# Patient Record
Sex: Female | Born: 2000 | Race: White | Hispanic: No | Marital: Single | Smoking: Never smoker
Health system: Southern US, Community
[De-identification: ages and names within clinical notes are randomized; demographics above are authoritative.]

## PROBLEM LIST (undated history)

## (undated) DIAGNOSIS — D649 Anemia, unspecified: Secondary | ICD-10-CM

---
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
## 2020-05-06 ENCOUNTER — Other Ambulatory Visit: Payer: Self-pay

## 2020-05-06 ENCOUNTER — Emergency Department (HOSPITAL_BASED_OUTPATIENT_CLINIC_OR_DEPARTMENT_OTHER)
Admission: EM | Admit: 2020-05-06 | Discharge: 2020-05-07 | Disposition: A | Discharge status: Home / Self Care | Payer: Managed Care, Other (non HMO) | Attending: Emergency Medicine | Admitting: Emergency Medicine

## 2020-05-06 ENCOUNTER — Encounter (HOSPITAL_BASED_OUTPATIENT_CLINIC_OR_DEPARTMENT_OTHER): Payer: Self-pay | Admitting: *Deleted

## 2020-05-06 DIAGNOSIS — Z5321 Procedure and treatment not carried out due to patient leaving prior to being seen by health care provider: Secondary | ICD-10-CM | POA: Insufficient documentation

## 2020-05-06 DIAGNOSIS — M79652 Pain in left thigh: Secondary | ICD-10-CM | POA: Insufficient documentation

## 2020-05-06 NOTE — ED Triage Notes (Signed)
Pt c/o left thigh pain w/o injury x 4 hrs

## 2022-03-08 ENCOUNTER — Ambulatory Visit
Admission: EM | Admit: 2022-03-08 | Discharge: 2022-03-08 | Disposition: A | Discharge status: Home / Self Care | Payer: Managed Care, Other (non HMO)

## 2022-03-08 ENCOUNTER — Encounter: Payer: Self-pay | Admitting: Emergency Medicine

## 2022-03-08 DIAGNOSIS — H00012 Hordeolum externum right lower eyelid: Secondary | ICD-10-CM | POA: Diagnosis not present

## 2022-03-08 HISTORY — DX: Anemia, unspecified: D64.9

## 2022-03-08 MED ORDER — CIPROFLOXACIN HCL 0.3 % OP SOLN: OPHTHALMIC | 0 refills | Status: AC

## 2022-03-08 MED ORDER — AMOXICILLIN-POT CLAVULANATE 875-125 MG PO TABS: 1.0000 | ORAL_TABLET | Freq: Two times a day (BID) | ORAL | 0 refills | Status: AC

## 2022-03-08 NOTE — ED Provider Notes (Signed)
[facility]    CSN: [id number] Arrival date & time: 03/08/22  1341    HISTORY   Chief Complaint  Patient presents with   Eye Problem   HPI Cindy Santos is a 21 y.o. female. Patient presents urgent care complaining of a stye to her bottom right eyelid for the past 4 days.  Patient states she has been applying warm compresses several times a day along with moist teabags with little relief of symptoms.  Patient states she is never had a stye before.  The history is provided by the patient.  Past Medical History:  Diagnosis Date   Anemia    There are no problems to display for this patient.  History reviewed. No pertinent surgical history. OB History   No obstetric history on file.    Home Medications    Prior to Admission medications   Medication Sig Start Date End Date Taking? Authorizing Provider  ferrous sulfate 325 (65 FE) MG tablet TAKE 1 TABLET (325 MG TOTAL) BY MOUTH 3 TIMES DAILY WITH MEALS. 09/22/21  Yes [provider]  fluticasone (FLONASE) 50 MCG/ACT nasal spray AP1 INTO EACH NOSTRIL AS NEEDED DURING ALLERGY SEASON. 03/04/15  Yes [provider]   Family History Family History  Family history unknown: Yes   Social History Social History   Tobacco Use   Smoking status: Never  Substance Use Topics   Alcohol use: Not Currently   Drug use: Not Currently   Allergies   Patient has no known allergies.  Review of Systems Review of Systems Pertinent findings noted in history of present illness.   Physical Exam Triage Vital Signs ED Triage Vitals  Enc Vitals Group     BP 08/15/21 0827 (!) 147/82     Pulse Rate 08/15/21 0827 72     Resp 08/15/21 0827 18     Temp 08/15/21 0827 98.3 F (36.8 C)     Temp Source 08/15/21 0827 Oral     SpO2 08/15/21 0827 98 %     Weight --      Height --      Head Circumference --      Peak Flow --      Pain Score 08/15/21 0826 5     Pain Loc --      Pain Edu? --      Excl. in [place]? --    No data found.  Updated Vital Signs BP 112/76   Pulse (!) 54   Temp 97.9 F (36.6 C)   Resp 18   SpO2 98%   Physical Exam Vitals and nursing note reviewed.  Constitutional:      General: She is not in acute distress.    Appearance: Normal appearance. She is not ill-appearing.  HENT:     Head: Normocephalic and atraumatic.     Salivary Glands: Right salivary gland is not diffusely enlarged or tender. Left salivary gland is not diffusely enlarged or tender.     Right Ear: Tympanic membrane, ear canal and external ear normal. No drainage. No middle ear effusion. There is no impacted cerumen. Tympanic membrane is not erythematous or bulging.     Left Ear: Tympanic membrane, ear canal and external ear normal. No drainage.  No middle ear effusion. There is no impacted cerumen. Tympanic membrane is not erythematous or bulging.     Nose: Nose normal. No nasal deformity, septal deviation, mucosal edema, congestion or rhinorrhea.     Right Turbinates: Not enlarged, swollen or  pale.     Left Turbinates: Not enlarged, swollen or pale.     Right Sinus: No maxillary sinus tenderness or frontal sinus tenderness.     Left Sinus: No maxillary sinus tenderness or frontal sinus tenderness.     Mouth/Throat:     Lips: Pink. No lesions.     Mouth: Mucous membranes are moist. No oral lesions.     Pharynx: Oropharynx is clear. Uvula midline. No posterior oropharyngeal erythema or uvula swelling.     Tonsils: No tonsillar exudate. 0 on the right. 0 on the left.  Eyes:     General: Lids are everted, no foreign bodies appreciated. Vision grossly intact.        Right eye: Hordeolum present. No foreign body or discharge.        Left eye: No foreign body, discharge or hordeolum.     Extraocular Movements: Extraocular movements intact.     Conjunctiva/sclera: Conjunctivae normal.     Right eye: Right conjunctiva is not injected.     Left eye: Left conjunctiva is not injected.  Neck:     Trachea: Trachea  and phonation normal.  Cardiovascular:     Rate and Rhythm: Normal rate and regular rhythm.     Pulses: Normal pulses.     Heart sounds: Normal heart sounds. No murmur heard.   No friction rub. No gallop.  Pulmonary:     Effort: Pulmonary effort is normal. No accessory muscle usage, prolonged expiration or respiratory distress.     Breath sounds: Normal breath sounds. No stridor, decreased air movement or transmitted upper airway sounds. No decreased breath sounds, wheezing, rhonchi or rales.  Chest:     Chest wall: No tenderness.  Musculoskeletal:        General: Normal range of motion.     Cervical back: Normal range of motion and neck supple. Normal range of motion.  Lymphadenopathy:     Cervical: No cervical adenopathy.  Skin:    General: Skin is warm and dry.     Findings: No erythema or rash.  Neurological:     General: No focal deficit present.     Mental Status: She is alert and oriented to person, place, and time.  Psychiatric:        Mood and Affect: Mood normal.        Behavior: Behavior normal.    Visual Acuity Right Eye Distance:   Left Eye Distance:   Bilateral Distance:    Right Eye Near:   Left Eye Near:    Bilateral Near:     UC Couse / Diagnostics / Procedures:    EKG  Radiology No results found.  Procedures Procedures (including critical care time)  UC Diagnoses / Final Clinical Impressions(s)   I have reviewed the triage vital signs and the nursing notes.  Pertinent labs & imaging results that were available during my care of the patient were reviewed by me and considered in my medical decision making (see chart for details).   Final diagnoses:  Hordeolum externum of right lower eyelid   Patient provided with cephalexin ophthalmic solution and Augmentin for possible preseptal cellulitis.  Return precautions advised.  ED Prescriptions     Medication Sig Dispense Auth. Provider   ciprofloxacin (CILOXAN) 0.3 % ophthalmic solution Administer 1  drop, every 2 hours, while awake, for 2 days. Then 1 drop, every 4 hours, while awake, for the next 5 days. 5 mL Lynden Oxford Scales, PA-C   amoxicillin-clavulanate (AUGMENTIN) 875-125 MG  tablet Take 1 tablet by mouth every 12 (twelve) hours for 5 days. 10 tablet Lynden Oxford Scales, PA-C      PDMP not reviewed this encounter.  Pending results:  Labs Reviewed - No data to display  Medications Ordered in UC: Medications - No data to display  Disposition Upon Discharge:  Condition: stable for discharge home Home: take medications as prescribed; routine discharge instructions as discussed; follow up as advised.  Patient presented with an acute illness with associated systemic symptoms and significant discomfort requiring urgent management. In my opinion, this is a condition that a prudent lay person (someone who possesses an average knowledge of health and medicine) may potentially expect to result in complications if not addressed urgently such as respiratory distress, impairment of bodily function or dysfunction of bodily organs.   Routine symptom specific, illness specific and/or disease specific instructions were discussed with the patient and/or caregiver at length.   As such, the patient has been evaluated and assessed, work-up was performed and treatment was provided in alignment with urgent care protocols and evidence based medicine.  Patient/parent/caregiver has been advised that the patient may require follow up for further testing and treatment if the symptoms continue in spite of treatment, as clinically indicated and appropriate.  If the patient was tested for COVID-19, Influenza and/or RSV, then the patient/parent/guardian was advised to isolate at home pending the results of his/her diagnostic coronavirus test and potentially longer if they're positive. I have also advised pt that if his/her COVID-19 test returns positive, it's recommended to self-isolate for at least 10 days  after symptoms first appeared AND until fever-free for 24 hours without fever reducer AND other symptoms have improved or resolved. Discussed self-isolation recommendations as well as instructions for household member/close contacts as per the [facility] and [place] DHHS, and also gave patient the [place] packet with this information.  Patient/parent/caregiver has been advised to return to the [facility] or PCP in 3-5 days if no better; to PCP or the Emergency Department if new signs and symptoms develop, or if the current signs or symptoms continue to change or worsen for further workup, evaluation and treatment as clinically indicated and appropriate  The patient will follow up with their current PCP if and as advised. If the patient does not currently have a PCP we will assist them in obtaining one.   The patient may need specialty follow up if the symptoms continue, in spite of conservative treatment and management, for further workup, evaluation, consultation and treatment as clinically indicated and appropriate.  Patient/parent/caregiver verbalized understanding and agreement of plan as discussed.  All questions were addressed during visit.  Please see discharge instructions below for further details of plan.  Discharge Instructions:   Discharge Instructions      Please continue warm compresses 3-4 times daily for 20 minutes each time.  I love your idea of warming up dried rice in a sock.  I have provided you with ciprofloxacin eyedrops, please use them exactly as prescribed for best results.  I have provided you with a prescription for Augmentin tablets that you should take twice daily for the next 5 days.  Please discard any personal hygiene or eye care products that you have used in the past few weeks as they are likely contaminated with the bacteria that is currently causing her infection.  Please use baby shampoo and a send sterile cottonball to clean your right eye to remove oils and buildup that  could be contributing to the blocked,  infected gland, avoiding cross contact with your left eye.  Baby shampoo is safe for use around the eyes and does not cause eye irritation.  I recommend that you wash your right eye at least twice a day.  After 5 days, if you have not seen meaningful improvement of the redness and swelling in your right lower eyelid, is very important that you follow-up with an ophthalmologist because they may wish to drain the stye.  This is not something that we can perform here at urgent care.  It should only be performed by an ophthalmologist.    If you do not have an ophthalmologist please feel free to go the emergency room and they will put you in contact with an ophthalmologist who can do this for you.  Thank you for visiting urgent care today.      This office note has been dictated using Museum/gallery curator.  Unfortunately, and despite my best efforts, this method of dictation can sometimes lead to occasional typographical or grammatical errors.  I apologize in advance if this occurs.     Lynden Oxford Scales, PA-C 03/09/22 1341

## 2022-03-08 NOTE — Discharge Instructions (Addendum)
Please continue warm compresses 3-4 times daily for 20 minutes each time.  I love your idea of warming up dried rice in a sock.  I have provided you with ciprofloxacin eyedrops, please use them exactly as prescribed for best results.  I have provided you with a prescription for Augmentin tablets that you should take twice daily for the next 5 days.  Please discard any personal hygiene or eye care products that you have used in the past few weeks as they are likely contaminated with the bacteria that is currently causing her infection.  Please use baby shampoo and a send sterile cottonball to clean your right eye to remove oils and buildup that could be contributing to the blocked, infected gland, avoiding cross contact with your left eye.  Baby shampoo is safe for use around the eyes and does not cause eye irritation.  I recommend that you wash your right eye at least twice a day.  After 5 days, if you have not seen meaningful improvement of the redness and swelling in your right lower eyelid, is very important that you follow-up with an ophthalmologist because they may wish to drain the stye.  This is not something that we can perform here at urgent care.  It should only be performed by an ophthalmologist.    If you do not have an ophthalmologist please feel free to go the emergency room and they will put you in contact with an ophthalmologist who can do this for you.  Thank you for visiting urgent care today.

## 2022-03-08 NOTE — ED Triage Notes (Signed)
Pt here with a stye to bottom right eyelid x 4 days. Has tried home remedies without relief.
